# Patient Record
Sex: Male | Born: 1965 | Race: Black or African American | Hispanic: No | Marital: Married | Smoking: Never smoker
Health system: Southern US, Community
[De-identification: ages and names within clinical notes are randomized; demographics above are authoritative.]

## PROBLEM LIST (undated history)

## (undated) DIAGNOSIS — E119 Type 2 diabetes mellitus without complications: Secondary | ICD-10-CM

## (undated) DIAGNOSIS — H3320 Serous retinal detachment, unspecified eye: Secondary | ICD-10-CM

## (undated) HISTORY — PX: CHOLECYSTECTOMY: SHX55

## (undated) HISTORY — PX: KNEE SURGERY: SHX244

## (undated) HISTORY — PX: RETINAL DETACHMENT SURGERY: SHX105

---

## 2004-11-26 ENCOUNTER — Ambulatory Visit: Payer: Self-pay | Admitting: Orthopedic Surgery

## 2005-01-22 ENCOUNTER — Other Ambulatory Visit: Payer: Self-pay

## 2005-01-27 ENCOUNTER — Ambulatory Visit: Payer: Self-pay | Admitting: Orthopedic Surgery

## 2005-11-06 ENCOUNTER — Ambulatory Visit: Payer: Self-pay | Admitting: Family Medicine

## 2006-01-08 ENCOUNTER — Emergency Department: Payer: Self-pay | Admitting: Emergency Medicine

## 2007-10-24 ENCOUNTER — Ambulatory Visit: Payer: Self-pay | Admitting: Internal Medicine

## 2007-10-26 ENCOUNTER — Ambulatory Visit: Payer: Self-pay | Admitting: Internal Medicine

## 2008-06-01 ENCOUNTER — Other Ambulatory Visit: Payer: Self-pay

## 2008-06-01 ENCOUNTER — Emergency Department: Payer: Self-pay | Admitting: Emergency Medicine

## 2008-06-21 ENCOUNTER — Ambulatory Visit: Payer: Self-pay | Admitting: Family Medicine

## 2009-06-25 ENCOUNTER — Emergency Department: Payer: Self-pay | Admitting: Emergency Medicine

## 2012-02-02 ENCOUNTER — Ambulatory Visit: Payer: Self-pay

## 2012-10-26 ENCOUNTER — Ambulatory Visit: Payer: Self-pay | Admitting: Internal Medicine

## 2013-01-12 ENCOUNTER — Ambulatory Visit: Payer: Self-pay | Admitting: Internal Medicine

## 2013-02-07 ENCOUNTER — Ambulatory Visit: Payer: Self-pay | Admitting: Emergency Medicine

## 2013-02-07 LAB — CBC WITH DIFFERENTIAL/PLATELET
Basophil %: 0.9 %
Eosinophil #: 0.2 10*3/uL (ref 0.0–0.7)
Eosinophil %: 1.9 %
HCT: 43.3 % (ref 40.0–52.0)
Lymphocyte #: 2.2 10*3/uL (ref 1.0–3.6)
Lymphocyte %: 24.5 %
MCH: 28.4 pg (ref 26.0–34.0)
MCV: 86 fL (ref 80–100)
Neutrophil #: 5.9 10*3/uL (ref 1.4–6.5)
Neutrophil %: 65.5 %
Platelet: 259 10*3/uL (ref 150–440)
WBC: 9 10*3/uL (ref 3.8–10.6)

## 2013-02-07 LAB — COMPREHENSIVE METABOLIC PANEL
Albumin: 3.7 g/dL (ref 3.4–5.0)
Alkaline Phosphatase: 108 U/L (ref 50–136)
Anion Gap: 11 (ref 7–16)
BUN: 11 mg/dL (ref 7–18)
Calcium, Total: 8.9 mg/dL (ref 8.5–10.1)
Co2: 26 mmol/L (ref 21–32)
Creatinine: 0.93 mg/dL (ref 0.60–1.30)
EGFR (African American): >60
EGFR (Non-African Amer.): >60
SGOT(AST): 12 U/L — ABNORMAL LOW (ref 15–37)
Total Protein: 7.6 g/dL (ref 6.4–8.2)

## 2013-03-18 ENCOUNTER — Ambulatory Visit: Payer: Self-pay | Admitting: Family Medicine

## 2014-01-14 ENCOUNTER — Ambulatory Visit: Payer: Self-pay | Admitting: Physician Assistant

## 2014-05-16 DIAGNOSIS — E119 Type 2 diabetes mellitus without complications: Secondary | ICD-10-CM | POA: Insufficient documentation

## 2014-05-16 DIAGNOSIS — M6740 Ganglion, unspecified site: Secondary | ICD-10-CM | POA: Insufficient documentation

## 2014-08-30 ENCOUNTER — Ambulatory Visit: Payer: Self-pay | Admitting: Physician Assistant

## 2015-01-11 DIAGNOSIS — E113519 Type 2 diabetes mellitus with proliferative diabetic retinopathy with macular edema, unspecified eye: Secondary | ICD-10-CM | POA: Insufficient documentation

## 2015-01-11 DIAGNOSIS — H3321 Serous retinal detachment, right eye: Secondary | ICD-10-CM | POA: Insufficient documentation

## 2015-01-11 DIAGNOSIS — H3341 Traction detachment of retina, right eye: Secondary | ICD-10-CM | POA: Insufficient documentation

## 2015-01-18 DIAGNOSIS — E669 Obesity, unspecified: Secondary | ICD-10-CM | POA: Insufficient documentation

## 2015-04-18 ENCOUNTER — Encounter: Payer: Self-pay | Admitting: Emergency Medicine

## 2015-04-18 ENCOUNTER — Ambulatory Visit: Payer: BLUE CROSS/BLUE SHIELD

## 2015-04-18 ENCOUNTER — Ambulatory Visit
Admission: EM | Admit: 2015-04-18 | Discharge: 2015-04-18 | Disposition: A | Discharge status: Home / Self Care | Payer: BLUE CROSS/BLUE SHIELD | Attending: Family Medicine | Admitting: Family Medicine

## 2015-04-18 DIAGNOSIS — M25461 Effusion, right knee: Secondary | ICD-10-CM | POA: Diagnosis not present

## 2015-04-18 DIAGNOSIS — M1711 Unilateral primary osteoarthritis, right knee: Secondary | ICD-10-CM | POA: Diagnosis not present

## 2015-04-18 DIAGNOSIS — M25561 Pain in right knee: Secondary | ICD-10-CM | POA: Diagnosis present

## 2015-04-18 HISTORY — DX: Serous retinal detachment, unspecified eye: H33.20

## 2015-04-18 HISTORY — DX: Type 2 diabetes mellitus without complications: E11.9

## 2015-04-18 LAB — URIC ACID: Uric Acid, Serum: 5.5 mg/dL (ref 4.4–7.6)

## 2015-04-18 MED ORDER — NAPROXEN 500 MG PO TABS: 500.0000 mg | ORAL_TABLET | Freq: Two times a day (BID) | ORAL | Status: DC

## 2015-04-18 NOTE — ED Notes (Signed)
Patient c/o right knee pain that started on Sunday.  Patient denies injury.

## 2015-04-18 NOTE — Discharge Instructions (Signed)
Seek medical attention if symptoms persist or worsen as discussed. °

## 2015-04-18 NOTE — ED Provider Notes (Signed)
Patient presents with right knee pain that started a few days ago. Patient denies any trauma or injury to the site. Patient does have a history of arthritis per patient. He states that he has had right knee surgery in the past but is unsure for what. He denies any history of gout. He does admit to having alcohol on Saturday. He also admits to doing a lot of physical activity over the weekend. His pain is mostly localized to the medial aspect of his knee. He denies any meniscal pathology in the past.  Review of systems negative except mentioned above. Vitals reviewed. Gen.-No apparent distress Respiratory-CTA bilateral Cardiac-regular rate rhythm Extremities-right knee: Mild effusion, no erythema, minimal warmth, decreased full extension and flexion due to discomfort, medial joint line tenderness, positive McMurray's, negative Lachman, negative anterior drawer, no significant varus or valgus instability, neurovascularly intact  Assessment and Plan: Patient with moderate degenerative changes to the right knee per x-ray. Given patient's medial knee pain he may also have meniscal pathology. He may require a MRI. Uric acid within normal limits. Naprosyn when necessary with food prescribed. Ice, rest, elevation also encouraged. I do recommend that patient follow up with orthopedics. I have suggested [facility]. Patient given disc of x-rays. I've encourage the patient to seek immediate medical attention if any worsening symptoms occur.  Jolene ProvostKirtida Wyndham Santilli, MD 04/18/15 [phone]

## 2015-12-05 DIAGNOSIS — E7800 Pure hypercholesterolemia, unspecified: Secondary | ICD-10-CM | POA: Insufficient documentation

## 2015-12-17 DIAGNOSIS — H43822 Vitreomacular adhesion, left eye: Secondary | ICD-10-CM | POA: Insufficient documentation

## 2016-03-27 DIAGNOSIS — I861 Scrotal varices: Secondary | ICD-10-CM | POA: Insufficient documentation

## 2016-08-17 ENCOUNTER — Ambulatory Visit
Admission: EM | Admit: 2016-08-17 | Discharge: 2016-08-17 | Disposition: A | Discharge status: Home / Self Care | Payer: BLUE CROSS/BLUE SHIELD | Attending: Family Medicine | Admitting: Family Medicine

## 2016-08-17 DIAGNOSIS — L0291 Cutaneous abscess, unspecified: Secondary | ICD-10-CM | POA: Diagnosis not present

## 2016-08-17 MED ORDER — SULFAMETHOXAZOLE-TRIMETHOPRIM 800-160 MG PO TABS: 1.0000 | ORAL_TABLET | Freq: Two times a day (BID) | ORAL | 0 refills | Status: AC

## 2016-08-17 NOTE — ED Triage Notes (Signed)
Patient has an abscess on the back of his head that occurred after getting his hair cut. Patient states that area has been draining and very painful. Patient states that this has been constant and worsening for 1.5 weeks.

## 2016-08-17 NOTE — ED Provider Notes (Signed)
[facility]    CSN: [id number] Arrival date & time: 08/17/16  0902     History   Chief Complaint Chief Complaint  Patient presents with  . Abscess    HPI Paul Mcgrath is a 50 y.o. male.   HPI: Patient presents today with draining abscess on occiput. Patient states that he noticed it a few days ago. He denies any fever or chills or any fluctuations in his blood sugar recently. He denies any known history of MRSA in the past. He denies any other lesions similar to this. He believes that the abscess may have started after he got his hair cut. He believes his last tetanus immunization was within the last 5 years.  Past Medical History:  Diagnosis Date  . Detached retina    right eye  . Diabetes mellitus without complication (HCC)     There are no active problems to display for this patient.   Past Surgical History:  Procedure Laterality Date  . CHOLECYSTECTOMY    . KNEE SURGERY Right        Home Medications    Prior to Admission medications   Medication Sig Start Date End Date Taking? Authorizing Provider  glipiZIDE (GLUCOTROL) 10 MG tablet Take 10 mg by mouth daily before breakfast.   Yes Historical Provider, MD  metFORMIN (GLUCOPHAGE) 500 MG tablet Take 500 mg by mouth 2 (two) times daily with a meal.   Yes Historical Provider, MD  sitaGLIPtin (JANUVIA) 50 MG tablet Take 50 mg by mouth daily.   Yes Historical Provider, MD  naproxen (NAPROSYN) 500 MG tablet Take 1 tablet (500 mg total) by mouth 2 (two) times daily. 04/18/15   Jolene Provost, MD  sulfamethoxazole-trimethoprim (BACTRIM DS,SEPTRA DS) 800-160 MG tablet Take 1 tablet by mouth 2 (two) times daily. 08/17/16 08/24/16  Jolene Provost, MD    Family History History reviewed. No pertinent family history.  Social History Social History  Substance Use Topics  . Smoking status: Never Smoker  . Smokeless tobacco: Never Used  . Alcohol use Yes     Allergies   Adhesive [tape] and Shrimp [shellfish  allergy]   Review of Systems Review of Systems:Negative except mentioned above.    Physical Exam Triage Vital Signs ED Triage Vitals  Enc Vitals Group     BP 08/17/16 0925 (!) 150/87     Pulse Rate 08/17/16 0925 72     Resp 08/17/16 0925 16     Temp 08/17/16 0925 98.6 F (37 C)     Temp Source 08/17/16 0925 Tympanic     SpO2 08/17/16 0925 98 %     Weight 08/17/16 0923 260 lb (117.9 kg)     Height 08/17/16 0923 5\' 11"  (1.803 m)     Head Circumference --      Peak Flow --      Pain Score 08/17/16 0924 8     Pain Loc --      Pain Edu? --      Excl. in [place]? --    No data found.   Updated Vital Signs BP (!) 150/87 (BP Location: Left Arm)   Pulse 72   Temp 98.6 F (37 C) (Tympanic)   Resp 16   Ht 5\' 11"  (1.803 m)   Wt 260 lb (117.9 kg)   SpO2 98%   BMI 36.26 kg/m     Physical Exam:  GENERAL: NAD RESP: CTA B CARD: RRR SKIN: quarter sized slightly fluctuant area on lower occiput, some  discharge that is matted, mild tenderness of site, no streaks appreciated  NEURO: CN II-XII grossly intact     UC Treatments / Results  Labs (all labs ordered are listed, but only abnormal results are displayed) Labs Reviewed - No data to display  EKG  EKG Interpretation None       Radiology No results found.  Procedures Procedures (including critical care time)  Medications Ordered in UC Medications - No data to display   Initial Impression / Assessment and Plan / UC Course  I have reviewed the triage vital signs and the nursing notes.  Pertinent labs & imaging results that were available during my care of the patient were reviewed by me and considered in my medical decision making (see chart for details).  Clinical Course   A/P: Abscess occiput - will treat with Bactrim DS 10 days, patient is to follow-up with primary care physician this week or [facility], encourage patient to apply warm compresses to the area a few times daily, if any worsening symptoms seek  immediate medical attention as discussed. Monitor blood sugars and for fever.  Final Clinical Impressions(s) / UC Diagnoses   Final diagnoses:  Abscess    New Prescriptions New Prescriptions   SULFAMETHOXAZOLE-TRIMETHOPRIM (BACTRIM DS,SEPTRA DS) 800-160 MG TABLET    Take 1 tablet by mouth 2 (two) times daily.     Jolene ProvostKirtida Kamden Reber, MD 08/17/16 [phone]

## 2016-08-18 ENCOUNTER — Telehealth: Payer: Self-pay | Admitting: Emergency Medicine

## 2016-08-18 ENCOUNTER — Other Ambulatory Visit: Payer: Self-pay

## 2016-08-18 ENCOUNTER — Ambulatory Visit (INDEPENDENT_AMBULATORY_CARE_PROVIDER_SITE_OTHER): Payer: BLUE CROSS/BLUE SHIELD | Admitting: Surgery

## 2016-08-18 ENCOUNTER — Encounter: Payer: Self-pay | Admitting: Surgery

## 2016-08-18 ENCOUNTER — Telehealth: Payer: Self-pay | Admitting: Surgery

## 2016-08-18 VITALS — BP 165/84 | HR 58 | Temp 97.9°F | Ht 71.0 in | Wt 271.0 lb

## 2016-08-18 DIAGNOSIS — L02811 Cutaneous abscess of head [any part, except face]: Secondary | ICD-10-CM | POA: Diagnosis not present

## 2016-08-18 DIAGNOSIS — L0291 Cutaneous abscess, unspecified: Secondary | ICD-10-CM | POA: Diagnosis not present

## 2016-08-18 DIAGNOSIS — E1165 Type 2 diabetes mellitus with hyperglycemia: Secondary | ICD-10-CM | POA: Insufficient documentation

## 2016-08-18 DIAGNOSIS — IMO0002 Reserved for concepts with insufficient information to code with codable children: Secondary | ICD-10-CM | POA: Insufficient documentation

## 2016-08-18 NOTE — Telephone Encounter (Signed)
Patient on schedule for today.

## 2016-08-18 NOTE — Patient Instructions (Signed)
Please come back tomorrow morning for a packing change on this dressing.

## 2016-08-18 NOTE — Telephone Encounter (Signed)
Returning patient's phone call.  Patient was told that his appointment with [facility] was scheduled for today 10/2 at 3:15pm at [facility].  Patient verbalized understanding.

## 2016-08-18 NOTE — Progress Notes (Signed)
  Surgical Consultation  08/18/2016  Paul Mcgrath is an 50 y.o. male.   CC: Occipital abscess  HPI: This a patient who is in the emergency room yesterday where an occipital abscess was identified and diagnosed. Apparently it was spontaneously draining and the patient was placed on Bactrim and asked to follow up with us today. This is causing him some pain but he denies fevers or chills has drained minimally  He is diabetic.  Past Medical History:  Diagnosis Date  . Detached retina    right eye  . Diabetes mellitus without complication [facility](HCC)     Past Surgical History:  Procedure Laterality Date  . CHOLECYSTECTOMY    . KNEE SURGERY Right     History reviewed. No pertinent family history.  Social History:  reports that he has never smoked. He has never used smokeless tobacco. He reports that he drinks alcohol. He reports that he does not use drugs.  Allergies:  Allergies  Allergen Reactions  . Adhesive [Tape] Rash    Blistering rash  . Shrimp [Shellfish Allergy] Swelling    Medications reviewed.   Review of Systems:   Review of Systems  Constitutional: Negative for chills, fever and weight loss.  HENT: Negative.   Eyes: Negative.   Respiratory: Negative.   Cardiovascular: Negative.   Gastrointestinal: Negative.   Genitourinary: Negative.   Musculoskeletal: Negative.   Skin: Negative.   Neurological: Negative.   Endo/Heme/Allergies: Negative.   Psychiatric/Behavioral: Negative.      Physical Exam:  There were no vitals taken for this visit.  Physical Exam  Constitutional: He is oriented to person, place, and time and well-developed, well-nourished, and in no distress. No distress.  HENT:  Head: Normocephalic and atraumatic.  Eyes: Pupils are equal, round, and reactive to light. Right eye exhibits no discharge. Left eye exhibits no discharge. No scleral icterus.  Neck: Normal range of motion.  Posterior neck mass at the occiput measuring approximately  2 x 2 centimeters with some surrounding erythema and purulent drainage minimal in nature. Tenderness is present  Lymphadenopathy:    He has no cervical adenopathy.  Neurological: He is alert and oriented to person, place, and time.  Skin: Skin is warm. No rash noted. He is not diaphoretic. There is erythema.  Psychiatric: Mood and affect normal.  Vitals reviewed.     No results found for this or any previous visit (from the past 48 hour(s)). No results found.  Assessment/Plan:  Urgent care reports reviewed. Patient presents with an abscess that is spontaneously draining albeit very minimal and I believe he has an underlying abscess that requires drainage measures prostate 2 x 2 centimeters with surrounding erythema  I'm recommending incision and drainage the options were reviewed the risk of bleeding infection recurrence and open wound were discussed as well as the potential for a drain placement or wick placement which could be removed tomorrow morning. And agreed to proceed.  Details of procedure: After obtaining informed consent, local anesthetic was infiltrated in skin and subcutaneous tissues tissues after the patient was prepped. A cruciate incision was then made and minimal purulence exuded this was cultured. An a quarter-inch Nu Gauze wick was placed as was a sterile dressing.  She tolerated the procedure well we will see him back tomorrow morning to change the wick.     Lattie Hawichard E Davier Tramell, MD, FACS

## 2016-08-18 NOTE — Telephone Encounter (Signed)
Patient has called and was referred to [facility] for a possible I&D of a cyst on the back of head. Patient was given antibiotic.   He complains of increased pain and would like to be seen today.   I did advise him that I would have a nurse call him back to discuss a day to have this done in the office due to the availability.

## 2016-08-18 NOTE — Addendum Note (Signed)
Addended by: Cameron ProudHILDERS, Znya Albino S on: 08/18/2016 04:24 PM   Modules accepted: Orders

## 2016-08-19 ENCOUNTER — Encounter: Payer: Self-pay | Admitting: Surgery

## 2016-08-19 ENCOUNTER — Ambulatory Visit (INDEPENDENT_AMBULATORY_CARE_PROVIDER_SITE_OTHER): Payer: BLUE CROSS/BLUE SHIELD | Admitting: Surgery

## 2016-08-19 ENCOUNTER — Ambulatory Visit: Payer: Self-pay | Admitting: Surgery

## 2016-08-19 VITALS — BP 161/91 | HR 66 | Temp 97.6°F | Ht 71.0 in | Wt 271.0 lb

## 2016-08-19 DIAGNOSIS — L02811 Cutaneous abscess of head [any part, except face]: Secondary | ICD-10-CM

## 2016-08-19 NOTE — Patient Instructions (Signed)
Please see your follow up appointment listed below.  °

## 2016-08-19 NOTE — ED Notes (Signed)
F/u phone contact with pt, states had abcess I&D'd by surgeon and "feels much better".

## 2016-08-19 NOTE — Progress Notes (Signed)
S post incision and drainage of occipital scalp mass. Patient feels better denies fevers or chills packing fell out as morning.  Much less erythema no purulence minimal necrosis at the skin edges shallow cavity no packing replaced  Cultures pending  Continue antibiotics and return on Thursday morning for reexamination

## 2016-08-21 ENCOUNTER — Encounter: Payer: Self-pay | Admitting: Surgery

## 2016-08-21 ENCOUNTER — Ambulatory Visit (INDEPENDENT_AMBULATORY_CARE_PROVIDER_SITE_OTHER): Payer: BLUE CROSS/BLUE SHIELD | Admitting: Surgery

## 2016-08-21 VITALS — BP 153/79 | HR 64 | Temp 97.6°F | Ht 71.0 in | Wt 271.0 lb

## 2016-08-21 DIAGNOSIS — L02811 Cutaneous abscess of head [any part, except face]: Secondary | ICD-10-CM

## 2016-08-21 LAB — AEROBIC CULTURE W GRAM STAIN (SUPERFICIAL SPECIMEN)

## 2016-08-21 LAB — AEROBIC CULTURE  (SUPERFICIAL SPECIMEN)

## 2016-08-21 NOTE — Patient Instructions (Signed)
Please follow-up next week as scheduled.  Please call with any questions or concerns prior to your next appointment.

## 2016-08-21 NOTE — Progress Notes (Signed)
Outpatient postop visit  08/21/2016  Greggory Stallionommy L Mcclane is an 50 y.o. male.    Procedure: I&D scalp abscess  CC: Wound  HPI: Status post I&D of a scalp abscess on Bactrim with cultures showing gram-positive cocci likely staph aureus sensitivities pending  Medications reviewed.    Physical Exam:  There were no vitals taken for this visit.    PE: Wound is granulating well some fibropurulent material is removed lightly without difficulty no overt purulence and no erythema at this point    Assessment/Plan:  Overall improving on Bactrim continue dressing changes and will follow-up late next week.  Lattie Hawichard E Krystyna Cleckley, MD, FACS

## 2016-08-25 ENCOUNTER — Telehealth: Payer: Self-pay

## 2016-08-25 NOTE — Telephone Encounter (Signed)
Spoke with patient at this time regarding Culture results-MRSA. Patient is currently taking Bactrim which is sensitive and no need to change at this time. Patient has follow up appointment on 08/28/16 with Dr. Aleen CampiPiscoya.

## 2016-08-25 NOTE — Telephone Encounter (Signed)
LVM for patient to call office regarding Culture results.  Patient is currently taking Bactrim which is sensitive.

## 2016-08-28 ENCOUNTER — Ambulatory Visit (INDEPENDENT_AMBULATORY_CARE_PROVIDER_SITE_OTHER): Payer: BLUE CROSS/BLUE SHIELD | Admitting: Surgery

## 2016-08-28 ENCOUNTER — Encounter: Payer: Self-pay | Admitting: Surgery

## 2016-08-28 DIAGNOSIS — L089 Local infection of the skin and subcutaneous tissue, unspecified: Secondary | ICD-10-CM

## 2016-08-28 DIAGNOSIS — L723 Sebaceous cyst: Secondary | ICD-10-CM | POA: Diagnosis not present

## 2016-08-28 NOTE — Progress Notes (Signed)
08/28/2016  HPI: Patient is status post incision and drainage of an infected scalp sebaceous cyst by Dr. Excell Seltzerooper on 10/2. He's been seen a few times postprocedure and he has been healing well. Today he presents 1 week after his last visit for follow-up. He reports that he has not noticed any further drainage and he has finished his antibiotic course without complications. He denies having any new pain or swelling over the area of the abscess.  Vital signs: BP (!) 145/84   Pulse 76   Temp 97.5 F (36.4 C) (Oral)   Ht 5\' 11"  (1.803 m)   Wt 120.7 kg (266 lb)   BMI 37.10 kg/m    Physical Exam: Constitutional: No acute distress Scalp: The posterior scalp has an area of the prior incision and drainage with a wound base that measures about a centimeter in diameter. This has healed almost completely now. There is no cellulitis no fluctuance and no expressible drainage. The area is nontender.  Assessment/Plan: 50 year old male status post I&D of an infected sebaceous cyst of the scalp.  I reassured the patient that the wound continues to heal well. He may shower and let the water run down and I have advised him not to scrub to heavily and dab dry only. Can follow up with us on an as-needed basis and have a advised the patient of signs and symptoms to look out for including worsening tenderness increased swelling or purulent drainage from that wound area. He knows to call the office if any of the symptoms were to happen. Patient agrees with this plan and all of his questions have been answered.   Howie IllJose Luis Savayah Waltrip, MD [facility]

## 2016-08-28 NOTE — Patient Instructions (Signed)
Please call our office if you have any questions or concerns.  

## 2017-08-25 ENCOUNTER — Ambulatory Visit
Admission: EM | Admit: 2017-08-25 | Discharge: 2017-08-25 | Disposition: A | Discharge status: Home / Self Care | Payer: BLUE CROSS/BLUE SHIELD | Attending: Family Medicine | Admitting: Family Medicine

## 2017-08-25 DIAGNOSIS — L03116 Cellulitis of left lower limb: Secondary | ICD-10-CM | POA: Diagnosis not present

## 2017-08-25 DIAGNOSIS — L97921 Non-pressure chronic ulcer of unspecified part of left lower leg limited to breakdown of skin: Secondary | ICD-10-CM

## 2017-08-25 MED ORDER — CEPHALEXIN 500 MG PO CAPS: 500.0000 mg | ORAL_CAPSULE | Freq: Four times a day (QID) | ORAL | 0 refills | Status: DC

## 2017-08-25 NOTE — Discharge Instructions (Signed)
Silvadene twice daily

## 2017-08-25 NOTE — ED Provider Notes (Signed)
[facility]    CSN: [id number] Arrival date & time: 08/25/17  1437     History   Chief Complaint Chief Complaint  Patient presents with  . Ankle Injury    left    HPI Paul Mcgrath is a 51 y.o. male.   51 yo male with a c/o left shin skin ulceration and surrounding slight redness, swelling and pain after injuring it one week ago. Patient states he fell on his treadmill at home scratching his left shin but does not seem to be healing fast enough. States area has had some slight drainage and he's been cleaning it with hydrogen peroxide daily. Denies any fevers or chills.    The history is provided by the patient.  Ankle Injury     Past Medical History:  Diagnosis Date  . Detached retina    right eye  . Diabetes mellitus without complication [facility])     Patient Active Problem List   Diagnosis Date Noted  . Infected sebaceous cyst of skin 08/28/2016  . Uncontrolled type 2 diabetes mellitus ([place]) 08/18/2016  . Left varicocele 03/27/2016  . 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 Hypercholesterolemia 12/05/2015  . Obesity 01/18/2015  . 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 Right retinal detachment 01/11/2015  . 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 Ganglion 05/16/2014  . Type II diabetes mellitus ([place]) 05/16/2014    Past Surgical History:  Procedure Laterality Date  . CHOLECYSTECTOMY    . KNEE SURGERY Right   . RETINAL DETACHMENT SURGERY         Home Medications    Prior to Admission medications   Medication Sig Start Date End Date Taking? Authorizing Provider  Blood Glucose Monitoring Suppl (FIFTY50 GLUCOSE METER 2.0) w/Device KIT Use as directed. 12/21/13  Yes [provider]  docusate sodium (STOOL SOFTENER) 100 MG capsule Take 100 mg by mouth. 05/03/14  Yes [provider]  dorzolamide-timolol (COSOPT) 22.3-6.8  MG/ML ophthalmic solution 1 drop 2 (two) times daily.   Yes [provider]  glipiZIDE (GLUCOTROL) 5 MG tablet Take 5 mg by mouth. 01/05/14  Yes [provider]  glucose blood (ONE TOUCH ULTRA TEST) test strip  03/05/16  Yes [provider]  metFORMIN (GLUCOPHAGE) 500 MG tablet Take 500 mg by mouth 2 (two) times daily with a meal.   Yes [provider]  simvastatin (ZOCOR) 20 MG tablet Take by mouth. 12/05/15 08/25/17 Yes [provider]  sitaGLIPtin (JANUVIA) 100 MG tablet TAKE 1 TABLET DAILY 01/30/16  Yes [provider]  sitaGLIPtin (JANUVIA) 50 MG tablet Take 50 mg by mouth daily.   Yes [provider]  cephALEXin (KEFLEX) 500 MG capsule Take 1 capsule (500 mg total) by mouth 4 (four) times daily. 08/25/17   Norval Gable, MD  naproxen (NAPROSYN) 500 MG tablet Take 1 tablet (500 mg total) by mouth 2 (two) times daily. 04/18/15   Paulina Fusi, MD    Family History Family History  Problem Relation Age of Onset  . Hypertension Mother   . Diabetes Mother   . Hypertension Father     Social History Social History  Substance Use Topics  . Smoking status: Never Smoker  . Smokeless tobacco: Never Used  . Alcohol use Yes     Allergies   Adhesive [tape] and Shrimp [shellfish allergy]   Review of Systems Review of Systems   Physical Exam Triage  Vital Signs ED Triage Vitals  Enc Vitals Group     BP 08/25/17 1519 (!) 159/87     Pulse Rate 08/25/17 1519 66     Resp 08/25/17 1519 18     Temp 08/25/17 1519 98.1 F (36.7 C)     Temp Source 08/25/17 1519 Oral     SpO2 08/25/17 1519 100 %     Weight 08/25/17 1518 275 lb (124.7 kg)     Height 08/25/17 1518 5' 11"  (1.803 m)     Head Circumference --      Peak Flow --      Pain Score 08/25/17 1518 2     Pain Loc --      Pain Edu? --      Excl. in [place]? --    No data found.   Updated Vital Signs BP (!) 159/87 (BP Location: Left Arm)   Pulse 66   Temp 98.1 F (36.7 C)  (Oral)   Resp 18   Ht 5' 11"  (1.803 m)   Wt 275 lb (124.7 kg)   SpO2 100%   BMI 38.35 kg/m   Visual Acuity Right Eye Distance:   Left Eye Distance:   Bilateral Distance:    Right Eye Near:   Left Eye Near:    Bilateral Near:     Physical Exam  Constitutional: He appears well-developed and well-nourished. No distress.  Skin: He is not diaphoretic. There is erythema.  3cm skin ulceration over left shin area with surrounding blanchable erythema, warmth, mild edema and tenderness to palpation  Nursing note and vitals reviewed.    UC Treatments / Results  Labs (all labs ordered are listed, but only abnormal results are displayed) Labs Reviewed - No data to display  EKG  EKG Interpretation None       Radiology No results found.  Procedures Procedures (including critical care time)  Medications Ordered in UC Medications - No data to display   Initial Impression / Assessment and Plan / UC Course  I have reviewed the triage vital signs and the nursing notes.  Pertinent labs & imaging results that were available during my care of the patient were reviewed by me and considered in my medical decision making (see chart for details).       Final Clinical Impressions(s) / UC Diagnoses   Final diagnoses:  Skin ulcer of left lower leg, limited to breakdown of skin ([place])  Cellulitis of left lower extremity    New Prescriptions Discharge Medication List as of 08/25/2017  4:10 PM    START taking these medications   Details  cephALEXin (KEFLEX) 500 MG capsule Take 1 capsule (500 mg total) by mouth 4 (four) times daily., Starting Tue 08/25/2017, Normal       1. diagnosis reviewed with patient 2. rx as per orders above; reviewed possible side effects, interactions, risks and benefits  3. Recommend supportive treatment with silvadene bid, elevate  4. Follow-up prn if symptoms worsen or don't improve Controlled Substance Prescriptions  Controlled Substance Registry  consulted? Not Applicable   Norval Gable, MD 08/25/17 [phone]

## 2017-08-25 NOTE — ED Triage Notes (Signed)
Patient states that one week ago he was on the treadmill and slipped and scratched the bottom of his shin. Patient states that area has not been healing. Patient states that he is a diabetic and is concerned about infection.

## 2017-12-31 ENCOUNTER — Encounter (INDEPENDENT_AMBULATORY_CARE_PROVIDER_SITE_OTHER): Payer: Self-pay

## 2018-04-05 ENCOUNTER — Ambulatory Visit
Admission: EM | Admit: 2018-04-05 | Discharge: 2018-04-05 | Disposition: A | Discharge status: Home / Self Care | Payer: BLUE CROSS/BLUE SHIELD | Attending: Registered Nurse | Admitting: Registered Nurse

## 2018-04-05 ENCOUNTER — Encounter: Payer: Self-pay | Admitting: Emergency Medicine

## 2018-04-05 ENCOUNTER — Ambulatory Visit (INDEPENDENT_AMBULATORY_CARE_PROVIDER_SITE_OTHER): Payer: BLUE CROSS/BLUE SHIELD

## 2018-04-05 ENCOUNTER — Other Ambulatory Visit: Payer: Self-pay

## 2018-04-05 DIAGNOSIS — M1711 Unilateral primary osteoarthritis, right knee: Secondary | ICD-10-CM | POA: Diagnosis not present

## 2018-04-05 DIAGNOSIS — S86911A Strain of unspecified muscle(s) and tendon(s) at lower leg level, right leg, initial encounter: Secondary | ICD-10-CM | POA: Diagnosis not present

## 2018-04-05 MED ORDER — ACETAMINOPHEN 500 MG PO TABS: 1000.0000 mg | ORAL_TABLET | Freq: Four times a day (QID) | ORAL | 0 refills | Status: AC | PRN

## 2018-04-05 NOTE — ED Provider Notes (Signed)
[facility]    CSN: [id number] Arrival date & time: 04/05/18  1003     History   Chief Complaint Chief Complaint  Patient presents with  . Knee Pain    right    HPI Paul Mcgrath is a 52 y.o. male.   52y/o established male patient here for evaluation right knee pain s/p tripping over a shoe and falling into his recliner last evening.  Felt and heard a pop from his knee.  Painful today elevated knee and took over the counter NSAID with some relief this morning.  Last blood sugar check yesterday-am fasting 150s pm after meal 200s.  Has can and knee brace at home but hasn't used it.  Limping around this am.  Denied locking or giving out.  Some swelling and knee warm to touch this morning.  Patient reported he walked from car in parking lot to check in desk and then was given a wheelchair; "I am limping a little"     Past Medical History:  Diagnosis Date  . Detached retina    right eye  . Diabetes mellitus without complication [facility])     Patient Active Problem List   Diagnosis Date Noted  . Infected sebaceous cyst of skin 08/28/2016  . Uncontrolled type 2 diabetes mellitus ([place]) 08/18/2016  . Left varicocele 03/27/2016  . 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 Hypercholesterolemia 12/05/2015  . Obesity 01/18/2015  . 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 Right retinal detachment 01/11/2015  . 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 Ganglion 05/16/2014  . Type II diabetes mellitus ([place]) 05/16/2014    Past Surgical History:  Procedure Laterality Date  . CHOLECYSTECTOMY    . KNEE SURGERY Right   . RETINAL DETACHMENT SURGERY         Home Medications    Prior to Admission medications   Medication Sig Start Date End Date Taking? Authorizing Provider  dorzolamide-timolol (COSOPT) 22.3-6.8 MG/ML ophthalmic solution 1 drop 2 (two) times daily.    Yes [provider]  Exenatide ER (BYDUREON) 2 MG SRER Inject into the skin. 04/01/18  Yes [provider]  glipiZIDE (GLUCOTROL) 5 MG tablet Take 5 mg by mouth. 01/05/14  Yes [provider]  metFORMIN (GLUCOPHAGE) 500 MG tablet Take 500 mg by mouth 2 (two) times daily with a meal.   Yes [provider]  simvastatin (ZOCOR) 20 MG tablet Take by mouth. 12/05/15 04/05/18 Yes [provider]  acetaminophen (TYLENOL) 500 MG tablet Take 2 tablets (1,000 mg total) by mouth every 6 (six) hours as needed for mild pain or moderate pain (one hour before bydureon). 04/05/18 05/05/18  Demani Weyrauch, Aura Fey, NP  Blood Glucose Monitoring Suppl (FIFTY50 GLUCOSE METER 2.0) w/Device KIT Use as directed. 12/21/13   [provider]  glucose blood (ONE TOUCH ULTRA TEST) test strip  03/05/16   [provider]    Family History Family History  Problem Relation Age of Onset  . Hypertension Mother   . Diabetes Mother   . Hypertension Father     Social History Social History   Tobacco Use  . Smoking status: Never Smoker  . Smokeless tobacco: Never Used  Substance Use Topics  . Alcohol use: Yes    Alcohol/week: 2.4 oz    Types: 4 Cans of beer per week  . Drug use: No     Allergies   Adhesive [tape] and  Shrimp [shellfish allergy]   Review of Systems Review of Systems  Constitutional: Negative for activity change, appetite change, chills, diaphoresis, fatigue and fever.  HENT: Negative for trouble swallowing and voice change.   Eyes: Negative for photophobia and visual disturbance.  Respiratory: Negative for cough, shortness of breath, wheezing and stridor.   Cardiovascular: Negative for chest pain.  Gastrointestinal: Negative for diarrhea, nausea and vomiting.  Endocrine: Negative for cold intolerance and heat intolerance.  Genitourinary: Negative for difficulty urinating.  Musculoskeletal: Positive for gait problem and joint swelling.    Allergic/Immunologic: Negative for food allergies.  Neurological: Negative for dizziness, tremors, syncope, weakness and numbness.  Hematological: Negative for adenopathy. Does not bruise/bleed easily.  Psychiatric/Behavioral: Negative for agitation, confusion and sleep disturbance.     Physical Exam Triage Vital Signs ED Triage Vitals  Enc Vitals Group     BP 04/05/18 1037 (!) 160/87     Pulse Rate 04/05/18 1037 66     Resp 04/05/18 1037 16     Temp 04/05/18 1037 97.7 F (36.5 C)     Temp Source 04/05/18 1037 Oral     SpO2 04/05/18 1037 98 %     Weight 04/05/18 1037 270 lb (122.5 kg)     Height 04/05/18 1037 5' 11"  (1.803 m)     Head Circumference --      Peak Flow --      Pain Score 04/05/18 1036 8     Pain Loc --      Pain Edu? --      Excl. in [place]? --    No data found.  Updated Vital Signs BP (!) 160/87 (BP Location: Left Arm)   Pulse 66   Temp 97.7 F (36.5 C) (Oral)   Resp 16   Ht 5' 11"  (1.803 m)   Wt 270 lb (122.5 kg)   SpO2 98%   BMI 37.66 kg/m    Physical Exam  Constitutional: He is oriented to person, place, and time. Vital signs are normal. He appears well-developed and well-nourished. He is active and cooperative.  Non-toxic appearance. He does not have a sickly appearance. He does not appear ill. No distress.  HENT:  Head: Normocephalic and atraumatic.  Right Ear: Hearing and external ear normal.  Left Ear: Hearing and external ear normal.  Nose: Nose normal.  Mouth/Throat: Uvula is midline, oropharynx is clear and moist and mucous membranes are normal. No oropharyngeal exudate.  Eyes: Pupils are equal, round, and reactive to light. Conjunctivae, EOM and lids are normal. Right eye exhibits no discharge. Left eye exhibits no discharge.  Neck: Trachea normal, normal range of motion and phonation normal. Neck supple. No tracheal tenderness present. No neck rigidity. No tracheal deviation, no edema, no erythema and normal range of motion present.   Cardiovascular: Normal rate, regular rhythm and intact distal pulses.  Pulses:      Radial pulses are 2+ on the right side, and 2+ on the left side.  Pulmonary/Chest: Effort normal and breath sounds normal. No stridor. No respiratory distress. He has no wheezes. He has no rhonchi. He has no rales.  No cough observed in exam room; spoke full sentences without difficulty  Abdominal: Soft. He exhibits no distension, no fluid wave and no ascites. There is no rigidity and no guarding.  Musculoskeletal: He exhibits edema and tenderness. He exhibits no deformity.       Right shoulder: Normal.       Left shoulder: Normal.  Right elbow: Normal.      Left elbow: Normal.       Right hip: Normal.       Left hip: Normal.       Right knee: He exhibits decreased range of motion, swelling, effusion and bony tenderness. He exhibits no ecchymosis, no deformity, no laceration, normal alignment, no LCL laxity, normal patellar mobility and no MCL laxity. Tenderness found. Medial joint line and lateral joint line tenderness noted. No MCL, no LCL and no patellar tendon tenderness noted.       Left knee: Normal.       Right ankle: Normal.       Left ankle: Normal.       Cervical back: Normal.       Thoracic back: Normal.       Lumbar back: Normal.       Right hand: Normal.       Left hand: Normal.       Legs: Right knee crepitus with arom flexion to extension; negative lachman's/valgus/varus stress/ patellar apprehension/anterior/posterior drawer sign; gait slow and steady in hallway with 4 inch ace bandage applied to right knee and after icing 15 minutes  Lymphadenopathy:    He has no cervical adenopathy.       Right cervical: No superficial cervical, no deep cervical and no posterior cervical adenopathy present.      Left cervical: No superficial cervical, no deep cervical and no posterior cervical adenopathy present.  Neurological: He is alert and oriented to person, place, and time. He has normal  strength. He is not disoriented. He displays no atrophy and no tremor. No sensory deficit. He exhibits normal muscle tone. He displays no seizure activity. Gait abnormal. Coordination normal. GCS eye subscore is 4. GCS verbal subscore is 5. GCS motor subscore is 6.  In/out of wheelchair without difficulty; gait slow and steady in hallway on discharge;   Skin: Skin is warm, dry and intact. Capillary refill takes less than 2 seconds. No abrasion, no bruising, no ecchymosis and no rash noted. He is not diaphoretic. No cyanosis or erythema. No pallor. Nails show no clubbing.  Psychiatric: He has a normal mood and affect. His speech is normal and behavior is normal. Judgment and thought content normal. He is not actively hallucinating. Cognition and memory are normal. He is attentive.  Nursing note and vitals reviewed.    UC Treatments / Results  Labs (all labs ordered are listed, but only abnormal results are displayed) Labs Reviewed - No data to display  EKG None  Radiology Dg Knee Complete 4 Views Right  Result Date: 04/05/2018 CLINICAL DATA:  Twisting injury last night when the patient tripped on she use. Right knee pain. EXAM: RIGHT KNEE - COMPLETE 4+ VIEW COMPARISON:  Plain films right knee 04/18/2015. FINDINGS: There is no acute bony or joint abnormality. Degenerative disease about the knee is unchanged in appearance. Osteophytosis is most prominent on the medial side. No joint effusion. IMPRESSION: No acute abnormality. No change in the appearance of osteoarthritis about the knee. Electronically Signed   By: Inge Rise M.D.   On: 04/05/2018 11:18  Reviewed results with patient and given printed copy of report prior to discharge and patient refused offer of images on disc  Patient verbalized understanding information and had no further questions at this time.  Procedures Procedures (including critical care time)  Medications Ordered in UC Medications - No data to display  Initial  Impression / Assessment and Plan / UC  Course  I have reviewed the triage vital signs and the nursing notes.  Pertinent labs & imaging results that were available during my care of the patient were reviewed by me and considered in my medical decision making (see chart for details).     Knee sprain right Final Clinical Impressions(s) / UC Diagnoses   Final diagnoses:  Strain of right knee, initial encounter  Arthritis of knee, right   Heel slides TID prn, consider seated weights/universal machine for exercise this week and recumbant bike at [employer] (his membership gym).  Avoid high impact or prolonged walking this week.  Patient was instructed to rest, ice 15 minutes TID and elevate right leg.  Medications as directed tylenol 1043m po QID avoid motrin/advil/aleve/naproxen/ibuprofen as can worsen kidney function with his diabetes medication.  Use can for ambulation until swelling and pain resolved.  Call or return to clinic as needed if these symptoms worsen or fail to improve as anticipated with PCM/orthopedics for re-evaluation/further imaging e.g. MRI.  Discussed with patient may take 4-6 weeks for complete healing but should notice improvement over the next 2 weeks.  Patient verbalized agreement and understanding of treatment plan and had no further questions at this time. P2:  ROM exercises, Stretching, and Hand out given   Discharge Instructions     Schedule follow up with orthopedics/PCM if no improvement with plan of care x 7days e.g. Hot joint/swelling/pain Wear knee sleeve for next month at home Use can when walking; stretch daily/AROM   ED Prescriptions    Medication Sig Dispense Auth. Provider   acetaminophen (TYLENOL) 500 MG tablet Take 2 tablets (1,000 mg total) by mouth every 6 (six) hours as needed for mild pain or moderate pain (one hour before bydureon). 100 tablet Annaliza Zia, TAura Fey NP     Controlled Substance Prescriptions [place] Controlled Substance Registry  consulted? Not Applicable   BOlen Cordial NP 04/05/18 1213

## 2018-04-05 NOTE — Discharge Instructions (Addendum)
Schedule follow up with orthopedics/PCM if no improvement with plan of care x 7 days e.g. Hot joint/swelling/pain Wear knee sleeve for next month at home Use cane when walking; stretch daily/AROM

## 2018-04-05 NOTE — ED Triage Notes (Signed)
Patient in today c/o right knee pain since last night. Patient is visually impaired and didn't see a shoe in front of the chair he was about to sit in. He tripped on the shoe and fell into the chair.

## 2019-10-21 ENCOUNTER — Other Ambulatory Visit: Payer: Self-pay

## 2019-10-21 DIAGNOSIS — Z20822 Contact with and (suspected) exposure to covid-19: Secondary | ICD-10-CM

## 2019-10-24 ENCOUNTER — Telehealth: Payer: Self-pay

## 2019-10-24 LAB — NOVEL CORONAVIRUS, NAA: SARS-CoV-2, NAA: NOT DETECTED

## 2019-10-24 NOTE — Telephone Encounter (Signed)
Patient advise that result are not back yet

## 2020-02-13 ENCOUNTER — Ambulatory Visit: Payer: Medicare Other | Attending: Internal Medicine

## 2020-02-13 ENCOUNTER — Other Ambulatory Visit: Payer: Self-pay

## 2020-02-13 DIAGNOSIS — Z23 Encounter for immunization: Secondary | ICD-10-CM

## 2020-02-13 NOTE — Progress Notes (Signed)
   Covid-19 Vaccination Clinic  Name:  Paul Mcgrath    MRN: [id number] DOB: [birth year]  02/13/2020  Paul Mcgrath was observed post Covid-19 immunization for 15 minutes without incident. He was provided with Vaccine Information Sheet and instruction to access the V-Safe system.   Paul Mcgrath was instructed to call 911 with any severe reactions post vaccine: Marland Kitchen Difficulty breathing  . Swelling of face and throat  . A fast heartbeat  . A bad rash all over body  . Dizziness and weakness   Immunizations Administered    Name Date Dose VIS Date Route   Pfizer COVID-19 Vaccine 02/13/2020 11:40 AM 0.3 mL 10/28/2019 Intramuscular   Manufacturer: [employer], [employer]   Lot: [id number]   NDC: [address]-7

## 2020-02-28 ENCOUNTER — Other Ambulatory Visit: Payer: Self-pay | Admitting: Orthopedic Surgery

## 2020-02-28 DIAGNOSIS — M25512 Pain in left shoulder: Secondary | ICD-10-CM

## 2020-03-07 ENCOUNTER — Ambulatory Visit: Payer: Medicare Other | Attending: Internal Medicine

## 2020-03-07 ENCOUNTER — Other Ambulatory Visit: Payer: Self-pay

## 2020-03-07 ENCOUNTER — Ambulatory Visit
Admission: RE | Admit: 2020-03-07 | Discharge: 2020-03-07 | Disposition: A | Discharge status: Home / Self Care | Payer: Medicare Other | Source: Ambulatory Visit | Attending: Orthopedic Surgery | Admitting: Orthopedic Surgery

## 2020-03-07 DIAGNOSIS — M25512 Pain in left shoulder: Secondary | ICD-10-CM | POA: Insufficient documentation

## 2020-03-07 DIAGNOSIS — Z23 Encounter for immunization: Secondary | ICD-10-CM

## 2020-03-07 NOTE — Progress Notes (Signed)
   Covid-19 Vaccination Clinic  Name:  EDGER HUSAIN    MRN: [id number] DOB: 1966-10-10  03/07/2020  Mr. Buffkin was observed post Covid-19 immunization for 30 minutes based on pre-vaccination screening without incident. He was provided with Vaccine Information Sheet and instruction to access the V-Safe system.   Mr. Alesi was instructed to call 911 with any severe reactions post vaccine: Marland Kitchen Difficulty breathing  . Swelling of face and throat  . A fast heartbeat  . A bad rash all over body  . Dizziness and weakness   Immunizations Administered    Name Date Dose VIS Date Route   Pfizer COVID-19 Vaccine 03/07/2020  1:11 PM 0.3 mL 01/11/2019 Intramuscular   Manufacturer: [employer], [employer]   Lot: [id number]   NDC: [address]-0

## 2020-09-10 ENCOUNTER — Ambulatory Visit: Payer: Medicare Other

## 2020-11-05 ENCOUNTER — Ambulatory Visit
Admission: EM | Admit: 2020-11-05 | Discharge: 2020-11-05 | Disposition: A | Discharge status: Home / Self Care | Payer: Medicare Other | Attending: Sports Medicine | Admitting: Sports Medicine

## 2020-11-05 ENCOUNTER — Other Ambulatory Visit: Payer: Self-pay

## 2020-11-05 ENCOUNTER — Encounter: Payer: Self-pay | Admitting: Emergency Medicine

## 2020-11-05 DIAGNOSIS — R0981 Nasal congestion: Secondary | ICD-10-CM | POA: Diagnosis not present

## 2020-11-05 DIAGNOSIS — J019 Acute sinusitis, unspecified: Secondary | ICD-10-CM | POA: Diagnosis not present

## 2020-11-05 MED ORDER — AMOXICILLIN-POT CLAVULANATE 875-125 MG PO TABS: 1.0000 | ORAL_TABLET | Freq: Two times a day (BID) | ORAL | 0 refills | Status: AC

## 2020-11-05 NOTE — ED Provider Notes (Signed)
[facility]    CSN: [id number] Arrival date & time: 11/05/20  2505      History   Chief Complaint Chief Complaint  Patient presents with   Cough    HPI Paul Mcgrath is a 54 y.o. male presenting for 3 week history of yellow/green nasal drainage and productive cough.  Also admits to sinus pressure.  Patient denies any improvement or worsening of symptoms.  He denies any fever, fatigue, body aches, ear pain, sinus pain, chest pain, breathing difficulty.  Patient states that he had a negative Covid test a couple of weeks ago at symptom onset.  Denies any known COVID-19 exposure.  He is fully vaccinated for COVID-19.  He has been taking over-the-counter Mucinex and says that it does help temporarily.  Past medical history significant for diabetes.  He denies any cardiopulmonary disease.  Patient has no other complaints or concerns.  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 Authorizing Provider  dorzolamide-timolol (COSOPT) 22.3-6.8 MG/ML ophthalmic solution 1 drop 2  (two) times daily.   Yes [provider]  glipiZIDE (GLUCOTROL) 5 MG tablet Take 5 mg by mouth. 01/05/14  Yes [provider]  glucose blood test strip  03/05/16  Yes [provider]  metFORMIN (GLUCOPHAGE) 500 MG tablet Take 500 mg by mouth 2 (two) times daily with a meal.   Yes [provider]  simvastatin (ZOCOR) 20 MG tablet Take by mouth. 12/05/15 11/05/20 Yes [provider]  amoxicillin-clavulanate (AUGMENTIN) 875-125 MG tablet Take 1 tablet by mouth every 12 (twelve) hours for 7 days. 11/05/20 11/12/20  Danton Clap, PA-C  Blood Glucose Monitoring Suppl (FIFTY50 GLUCOSE METER 2.0) w/Device KIT Use as directed. 12/21/13   [provider]  Exenatide ER 2 MG SRER Inject into the skin. 04/01/18   [provider]    Family History Family History  Problem Relation Age of Onset   Hypertension Mother    Diabetes Mother    Hypertension Father     Social History Social History   Tobacco Use   Smoking status: Never Smoker   Smokeless tobacco: Never Used  Scientific laboratory technician Use: Never used  Substance Use Topics   Alcohol use: Yes    Alcohol/week: 4.0 standard drinks    Types: 4 Cans of beer per week   Drug use: No     Allergies   Adhesive [tape] and Shrimp [shellfish allergy]   Review of Systems Review of Systems  Constitutional: Negative for fatigue and fever.  HENT: Positive for  congestion, rhinorrhea and sinus pressure. Negative for sinus pain and sore throat.   Respiratory: Positive for cough. Negative for shortness of breath and wheezing.   Cardiovascular: Negative for chest pain.  Gastrointestinal: Negative for abdominal pain, diarrhea, nausea and vomiting.  Musculoskeletal: Negative for myalgias.  Neurological: Negative for weakness, light-headedness and headaches.  Hematological: Negative for adenopathy.     Physical Exam Triage Vital Signs ED Triage Vitals  Enc Vitals Group     BP 11/05/20  1030 (!) 146/85     Pulse Rate 11/05/20 1030 82     Resp 11/05/20 1030 18     Temp 11/05/20 1030 97.7 F (36.5 C)     Temp Source 11/05/20 1030 Oral     SpO2 11/05/20 1030 98 %     Weight 11/05/20 1028 270 lb 1 oz (122.5 kg)     Height 11/05/20 1028 5' 11"  (1.803 m)     Head Circumference --      Peak Flow --      Pain Score 11/05/20 1027 0     Pain Loc --      Pain Edu? --      Excl. in [place]? --    No data found.  Updated Vital Signs BP (!) 146/85 (BP Location: Left Arm)    Pulse 82    Temp 97.7 F (36.5 C) (Oral)    Resp 18    Ht 5' 11"  (1.803 m)    Wt 270 lb 1 oz (122.5 kg)    SpO2 98%    BMI 37.67 kg/m       Physical Exam Vitals and nursing note reviewed.  Constitutional:      General: He is not in acute distress.    Appearance: Normal appearance. He is well-developed and well-nourished. He is not ill-appearing or diaphoretic.  HENT:     Head: Normocephalic and atraumatic.     Right Ear: Tympanic membrane, ear canal and external ear normal.     Left Ear: Tympanic membrane, ear canal and external ear normal.     Nose: Congestion and rhinorrhea (moderate yellowish drainage) present.     Mouth/Throat:     Mouth: Oropharynx is clear and moist and mucous membranes are normal. Mucous membranes are moist.     Pharynx: Oropharynx is clear. Uvula midline. No posterior oropharyngeal erythema.     Tonsils: No tonsillar abscesses.  Eyes:     General: No scleral icterus.       Right eye: No discharge.        Left eye: No discharge.     Extraocular Movements: EOM normal.     Conjunctiva/sclera: Conjunctivae normal.  Neck:     Thyroid: No thyromegaly.     Trachea: No tracheal deviation.  Cardiovascular:     Rate and Rhythm: Normal rate and regular rhythm.     Heart sounds: Normal heart sounds.  Pulmonary:     Effort: Pulmonary effort is normal. No respiratory distress.     Breath sounds: Normal breath sounds. No wheezing or rales.  Musculoskeletal:     Cervical back: Normal  range of motion and neck supple.  Lymphadenopathy:     Cervical: No cervical adenopathy.  Skin:    General: Skin is warm and dry.     Findings: No rash.  Neurological:     General: No focal deficit present.     Mental Status: He is alert. Mental status is at baseline.     Motor: No weakness.  Gait: Gait normal.  Psychiatric:        Mood and Affect: Mood normal.        Behavior: Behavior normal.        Thought Content: Thought content normal.      UC Treatments / Results  Labs (all labs ordered are listed, but only abnormal results are displayed) Labs Reviewed - No data to display  EKG   Radiology No results found.  Procedures Procedures (including critical care time)  Medications Ordered in UC Medications - No data to display  Initial Impression / Assessment and Plan / UC Course  I have reviewed the triage vital signs and the nursing notes.  Pertinent labs & imaging results that were available during my care of the patient were reviewed by me and considered in my medical decision making (see chart for details).   54 year old male with 3-week history of nasal congestion and productive cough.  All vital signs are stable.  He is afebrile.  He does have moderate yellowish nasal drainage.  His chest is clear to auscultation.  Treating patient for acute sinusitis with Augmentin.  Advised to continue at home Flonase and Mucinex.  Advised increased rest and fluids.  Advised following up as needed for any new or worsening symptoms.  ED precautions reviewed with patient.   Final Clinical Impressions(s) / UC Diagnoses   Final diagnoses:  Acute sinusitis, recurrence not specified, unspecified location  Nasal congestion     Discharge Instructions     SINUSITIS: May consider use of intranasal steroids, such as Flonase as well. Use medications as directed. If antibiotics are prescribed, take the full course of antibiotics. Also consider use of Sudafed or Mucinex D.  Increase rest, fluids. If you do not improve or if you worsen after a course of antibiotics, you should be re-examined. You may need a different antibiotic or further evaluation with imaging or an exam of the inside of the sinuses     ED Prescriptions    Medication Sig Dispense Auth. Provider   amoxicillin-clavulanate (AUGMENTIN) 875-125 MG tablet Take 1 tablet by mouth every 12 (twelve) hours for 7 days. 14 tablet Gretta Cool     PDMP not reviewed this encounter.   Danton Clap, PA-C 11/05/20 1122

## 2020-11-05 NOTE — Discharge Instructions (Signed)
SINUSITIS: May consider use of intranasal steroids, such as Flonase as well. Use medications as directed. If antibiotics are prescribed, take the full course of antibiotics. Also consider use of Sudafed or Mucinex D. Increase rest, fluids. If you do not improve or if you worsen after a course of antibiotics, you should be re-examined. You may need a different antibiotic or further evaluation with imaging or an exam of the inside of the sinuses

## 2020-11-05 NOTE — ED Triage Notes (Signed)
Pt c/o cough, runny nose and nasal congestion. Started about 2 weeks ago. He has been tested for covid and was negative. Denies fever.

## 2020-11-19 ENCOUNTER — Other Ambulatory Visit: Payer: Self-pay

## 2020-11-19 ENCOUNTER — Ambulatory Visit
Admission: EM | Admit: 2020-11-19 | Discharge: 2020-11-19 | Discharge status: Against Medical Advice | Payer: Medicare Other

## 2020-11-19 NOTE — ED Notes (Signed)
Pt states he now has an appt with his PCP for today and left urgent care.

## 2021-03-30 ENCOUNTER — Ambulatory Visit
Admission: EM | Admit: 2021-03-30 | Discharge: 2021-03-30 | Disposition: A | Discharge status: Home / Self Care | Payer: Medicare Other

## 2021-03-30 ENCOUNTER — Other Ambulatory Visit: Payer: Self-pay

## 2021-03-30 ENCOUNTER — Encounter: Payer: Self-pay | Admitting: Emergency Medicine

## 2021-03-30 DIAGNOSIS — L03113 Cellulitis of right upper limb: Secondary | ICD-10-CM

## 2021-03-30 DIAGNOSIS — W57XXXA Bitten or stung by nonvenomous insect and other nonvenomous arthropods, initial encounter: Secondary | ICD-10-CM | POA: Diagnosis not present

## 2021-03-30 DIAGNOSIS — S40861A Insect bite (nonvenomous) of right upper arm, initial encounter: Secondary | ICD-10-CM

## 2021-03-30 MED ORDER — DOXYCYCLINE HYCLATE 100 MG PO CAPS: 100.0000 mg | ORAL_CAPSULE | Freq: Two times a day (BID) | ORAL | 0 refills | Status: AC

## 2021-03-30 NOTE — Discharge Instructions (Signed)
The area where the tick bit you does look very inflamed and swollen.  All of the swelling and inflammation can just be due to the bite itself.  However, since you are diabetic there is concern for possible infection.  We will cover you for infection at this time with doxycycline which is also used for tickborne illnesses.  Complete full course.  You can continue icing the area to help with swelling and keeping it elevated.  Clean with soap and water and apply Neosporin.  If you notice any increased swelling, redness or start to have a fever or feel generally unwell, please return or go to the emergency department for another evaluation.

## 2021-03-30 NOTE — ED Triage Notes (Signed)
Patient c/o tick bite to his right upper arm on Thursday.  Patient reports redness and swelling around the site.  Patient denies fevers.

## 2021-03-30 NOTE — ED Provider Notes (Signed)
[facility]    CSN: [id number] Arrival date & time: 03/30/21  0940      History   Chief Complaint Chief Complaint  Patient presents with  . Insect Bite    tick    HPI Paul Mcgrath is a 55 y.o. diabetic male presenting for redness and swelling of the right upper arm x2 days.  Patient states that he pulled a tick out of his arm a couple days ago and started to have subsequent redness and swelling after that.  He says the redness and swelling have gone down since icing the area.  He is also been cleaning it and applying Neosporin.  Patient concerned because the area feels hard and is tender.  He says the tick could not have been there any longer than a few hours.  Patient denies any systemic symptoms such as fever, fatigue, chills, achiness.  No swelling of any joint.  No other complaints or concerns.  HPI  Past Medical History:  Diagnosis Date  . Detached retina    right eye  . Diabetes mellitus without complication [facility])     Patient Active Problem List   Diagnosis Date Noted  . Infected sebaceous cyst of skin 08/28/2016  . Uncontrolled type 2 diabetes mellitus ([place]) 08/18/2016  . Left varicocele 03/27/2016  . 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 Hypercholesterolemia 12/05/2015  . Obesity 01/18/2015  . 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 Right retinal detachment 01/11/2015  . 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 Ganglion 05/16/2014  . Type II diabetes mellitus ([place]) 05/16/2014    Past Surgical History:  Procedure Laterality Date  . CHOLECYSTECTOMY    . KNEE SURGERY Right   . RETINAL DETACHMENT SURGERY         Home Medications    Prior to Admission medications   Medication Sig Start Date End Date Taking? Authorizing Provider  dorzolamide-timolol (COSOPT) 22.3-6.8 MG/ML ophthalmic solution 1 drop 2 (two) times daily.   Yes  [provider]  doxycycline (VIBRAMYCIN) 100 MG capsule Take 1 capsule (100 mg total) by mouth 2 (two) times daily for 10 days. 03/30/21 04/09/21 Yes Laurene Footman B, PA-C  glipiZIDE (GLUCOTROL) 5 MG tablet Take 5 mg by mouth. 01/05/14  Yes [provider]  hydrochlorothiazide (HYDRODIURIL) 12.5 MG tablet Take 12.5 mg by mouth daily. 03/16/21  Yes [provider]  losartan (COZAAR) 100 MG tablet Take 1 tablet by mouth daily. 02/23/21  Yes [provider]  metFORMIN (GLUCOPHAGE) 500 MG tablet Take 500 mg by mouth 2 (two) times daily with a meal.   Yes [provider]  simvastatin (ZOCOR) 20 MG tablet Take by mouth. 12/05/15 03/30/21 Yes [provider]  Blood Glucose Monitoring Suppl (FIFTY50 GLUCOSE METER 2.0) w/Device KIT Use as directed. 12/21/13   [provider]  Exenatide ER 2 MG SRER Inject into the skin. 04/01/18   [provider]  glucose blood test strip  03/05/16   [provider]    Family History Family History  Problem Relation Age of Onset  . Hypertension Mother   . Diabetes Mother   . Hypertension Father     Social History Social History   Tobacco Use  . Smoking status: Never Smoker  . Smokeless tobacco: Never Used  Vaping Use  . Vaping Use: Never used  Substance Use Topics  . Alcohol use: Yes    Alcohol/week: 4.0  standard drinks    Types: 4 Cans of beer per week  . Drug use: No     Allergies   Adhesive [tape] and Shrimp [shellfish allergy]   Review of Systems Review of Systems  Constitutional: Negative for chills, fatigue and fever.  Musculoskeletal: Negative for arthralgias and joint swelling.  Skin: Positive for color change and rash.  Neurological: Negative for weakness.     Physical Exam Triage Vital Signs ED Triage Vitals  Enc Vitals Group     BP 03/30/21 0958 137/80     Pulse Rate 03/30/21 0958 81     Resp 03/30/21 0958 16     Temp 03/30/21 0958 98.2 F (36.8 C)      Temp Source 03/30/21 0958 Oral     SpO2 03/30/21 0958 98 %     Weight 03/30/21 0956 255 lb (115.7 kg)     Height 03/30/21 0956 _0  (1.803 m)     Head Circumference --      Peak Flow --      Pain Score 03/30/21 0956 0     Pain Loc --      Pain Edu? --      Excl. in [place]? --    No data found.  Updated Vital Signs BP 137/80 (BP Location: Left Arm)   Pulse 81   Temp 98.2 F (36.8 C) (Oral)   Resp 16   Ht _1  (1.803 m)   Wt 255 lb (115.7 kg)   SpO2 98%   BMI 35.57 kg/m       Physical Exam Vitals and nursing note reviewed.  Constitutional:      General: He is not in acute distress.    Appearance: Normal appearance. He is well-developed. He is not ill-appearing.  HENT:     Head: Normocephalic and atraumatic.  Eyes:     General: No scleral icterus.    Conjunctiva/sclera: Conjunctivae normal.  Cardiovascular:     Rate and Rhythm: Normal rate and regular rhythm.     Heart sounds: Normal heart sounds.  Pulmonary:     Effort: Pulmonary effort is normal. No respiratory distress.     Breath sounds: Normal breath sounds.  Musculoskeletal:     Cervical back: Neck supple.  Skin:    General: Skin is warm and dry.     Comments: Right upper arm: About the biceps region, there is a small open wound with surrounding crusted material.  There is induration and erythema that extends about 2 cm in all directions.  Area is tender to palpation.  No active drainage.  Good range of motion of his elbow and shoulder.  Neurological:     General: No focal deficit present.     Mental Status: He is alert. Mental status is at baseline.     Motor: No weakness.     Gait: Gait normal.  Psychiatric:        Mood and Affect: Mood normal.        Behavior: Behavior normal.        Thought Content: Thought content normal.      UC Treatments / Results  Labs (all labs ordered are listed, but only abnormal results are displayed) Labs Reviewed - No data to display  EKG   Radiology No results  found.  Procedures Procedures (including critical care time)  Medications Ordered in UC Medications - No data to display  Initial Impression / Assessment and Plan / UC Course  I have reviewed the triage  vital signs and the nursing notes.  Pertinent labs & imaging results that were available during my care of the patient were reviewed by me and considered in my medical decision making (see chart for details).   55 year old male presenting for redness and swelling associated with tick bite x2 days.  Area is concerning for early cellulitis versus localized inflammatory/allergic type reaction.  Advised him to take Benadryl as needed for any itching.  Advised Tylenol for discomfort.  Advised to continue with cryotherapy.  Advised cleaning with soap and water and then applying the Neosporin.  Advise close monitoring.  We will start him on doxycycline to cover him for potential infection.  Also discussed that this is not really a Lyme endemic area, but the doxycycline used to treat the infection should cover that as well as other tickborne illnesses.  Advised him to follow-up with [place] for any new or worsening symptoms or if not improved after taking the medicine.  Patient agrees.   Final Clinical Impressions(s) / UC Diagnoses   Final diagnoses:  Cellulitis of right upper extremity  Tick bite of right upper arm, initial encounter     Discharge Instructions     The area where the tick bit you does look very inflamed and swollen.  All of the swelling and inflammation can just be due to the bite itself.  However, since you are diabetic there is concern for possible infection.  We will cover you for infection at this time with doxycycline which is also used for tickborne illnesses.  Complete full course.  You can continue icing the area to help with swelling and keeping it elevated.  Clean with soap and water and apply Neosporin.  If you notice any increased swelling, redness or start to have a fever or  feel generally unwell, please return or go to the emergency department for another evaluation.    ED Prescriptions    Medication Sig Dispense Auth. Provider   doxycycline (VIBRAMYCIN) 100 MG capsule Take 1 capsule (100 mg total) by mouth 2 (two) times daily for 10 days. 20 capsule Danton Clap, PA-C     PDMP not reviewed this encounter.   Danton Clap, PA-C 03/30/21 1018

## 2021-11-12 ENCOUNTER — Ambulatory Visit (INDEPENDENT_AMBULATORY_CARE_PROVIDER_SITE_OTHER): Payer: Medicare Other

## 2021-11-12 ENCOUNTER — Other Ambulatory Visit: Payer: Self-pay

## 2021-11-12 ENCOUNTER — Ambulatory Visit
Admission: EM | Admit: 2021-11-12 | Discharge: 2021-11-12 | Disposition: A | Discharge status: Home / Self Care | Payer: Medicare Other | Attending: Internal Medicine | Admitting: Internal Medicine

## 2021-11-12 DIAGNOSIS — S2239XA Fracture of one rib, unspecified side, initial encounter for closed fracture: Secondary | ICD-10-CM

## 2021-11-12 DIAGNOSIS — S2241XA Multiple fractures of ribs, right side, initial encounter for closed fracture: Secondary | ICD-10-CM

## 2021-11-12 DIAGNOSIS — W19XXXA Unspecified fall, initial encounter: Secondary | ICD-10-CM

## 2021-11-12 MED ORDER — HYDROCODONE-ACETAMINOPHEN 5-325 MG PO TABS: 1.0000 | ORAL_TABLET | Freq: Four times a day (QID) | ORAL | 0 refills | Status: AC | PRN

## 2021-11-12 NOTE — ED Triage Notes (Signed)
Pt c/o fall on Friday morning. Pt states that he went to use the restroom in the dark and his dog had ran under his feet and he tripped and hit the bathtub. Pt states that he is now having right side rib pain and back pain.

## 2021-11-12 NOTE — Discharge Instructions (Addendum)
Apply ice on area of pain for 2 days Follow up with your primary care provider next week.

## 2021-11-12 NOTE — ED Provider Notes (Signed)
[facility]    CSN: [id number] Arrival date & time: 11/12/21  1120      History   Chief Complaint Chief Complaint  Patient presents with   Fall    HPI Paul Mcgrath is a 55 y.o. male who presents with R lateral rib pain since he tripped on his dog when going to the bathroom 3 days ago. Has been taking Ibuprofen 800 mg and is not helping his pain.     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 Authorizing Provider  Blood Glucose Monitoring Suppl (FIFTY50 GLUCOSE METER 2.0) w/Device KIT Use as directed. 12/21/13  Yes [provider]  dorzolamide-timolol (COSOPT) 22.3-6.8 MG/ML ophthalmic solution 1 drop 2 (two) times daily.   Yes [provider]  Exenatide ER 2 MG SRER Inject into the skin. 04/01/18  Yes [provider]  glipiZIDE (GLUCOTROL) 5 MG tablet Take 5 mg by mouth. 01/05/14  Yes [provider]  glucose blood test strip  03/05/16  Yes [provider]  hydrochlorothiazide (HYDRODIURIL) 12.5 MG tablet Take 12.5 mg by mouth daily. 03/16/21  Yes  [provider]  HYDROcodone-acetaminophen (NORCO/VICODIN) 5-325 MG tablet Take 1 tablet by mouth every 6 (six) hours as needed. 11/12/21  Yes Rodriguez-Southworth, Sunday Spillers, PA-C  losartan (COZAAR) 100 MG tablet Take 1 tablet by mouth daily. 02/23/21  Yes [provider]  metFORMIN (GLUCOPHAGE) 500 MG tablet Take 500 mg by mouth 2 (two) times daily with a meal.   Yes [provider]  simvastatin (ZOCOR) 20 MG tablet Take by mouth. 12/05/15 11/12/21 Yes [provider]    Family History Family History  Problem Relation Age of Onset   Hypertension Mother    Diabetes Mother    Hypertension Father     Social History Social History   Tobacco Use   Smoking status: Never   Smokeless tobacco: Never  Vaping Use   Vaping Use: Never used  Substance Use Topics   Alcohol use: Yes    Alcohol/week: 4.0 standard drinks    Types: 4 Cans of beer per week   Drug use: No     Allergies   Adhesive [tape] and Shrimp [shellfish allergy]   Review of Systems Review of Systems  Gastrointestinal:  Negative for abdominal pain.  Musculoskeletal:  Negative for back pain.       + lateral mid rib pain  Skin:  Negative for color change and wound.    Physical Exam Triage Vital Signs ED Triage Vitals  Enc Vitals Group     BP 11/12/21  1310 (!) 147/84     Pulse Rate 11/12/21 1310 78     Resp 11/12/21 1310 18     Temp 11/12/21 1310 97.9 F (36.6 C)     Temp Source 11/12/21 1310 Oral     SpO2 11/12/21 1310 96 %     Weight 11/12/21 1309 260 lb (117.9 kg)     Height 11/12/21 1309 5' 11"  (1.803 m)     Head Circumference --      Peak Flow --      Pain Score 11/12/21 1308 8     Pain Loc --      Pain Edu? --      Excl. in [place]? --    No data found.  Updated Vital Signs BP (!) 147/84 (BP Location: Left Arm)    Pulse 78    Temp 97.9 F (36.6 C) (Oral)    Resp 18    Ht 5' 11"  (1.803 m)    Wt 260 lb (117.9 kg)    SpO2 96%    BMI 36.26 kg/m   Visual Acuity Right Eye  Distance:   Left Eye Distance:   Bilateral Distance:    Right Eye Near:   Left Eye Near:    Bilateral Near:     Physical Exam Vitals and nursing note reviewed.  Constitutional:      General: He is not in acute distress.    Appearance: He is obese. He is not toxic-appearing.  Eyes:     General: No scleral icterus.    Conjunctiva/sclera: Conjunctivae normal.  Pulmonary:     Effort: Pulmonary effort is normal.     Comments: On lateral ribs, there is no crepitations Chest:     Chest wall: Tenderness present.  Musculoskeletal:     Cervical back: Neck supple.  Skin:    Findings: No bruising, erythema or rash.  Neurological:     Mental Status: He is alert and oriented to person, place, and time.     Gait: Gait normal.  Psychiatric:        Mood and Affect: Mood normal.        Behavior: Behavior normal.        Thought Content: Thought content normal.        Judgment: Judgment normal.     UC Treatments / Results  Labs (all labs ordered are listed, but only abnormal results are displayed) Labs Reviewed - No data to display  EKG   Radiology DG Ribs Unilateral W/Chest Right  Result Date: 11/12/2021 CLINICAL DATA:  Right-sided pain post fall EXAM: RIGHT RIBS AND CHEST - 3+ VIEW COMPARISON:  06/01/2008 FINDINGS: No pneumothorax.  No pleural effusion.  Right lung clear. Minimally displaced fractures, lateral aspect right eighth and ninth ribs. Linear scarring or subsegmental atelectasis laterally at the left lung base. Heart size normal. IMPRESSION: Right eighth and ninth rib fractures, without pneumothorax or effusion. Electronically Signed   By: Lucrezia Europe M.D.   On: 11/12/2021 13:47    Procedures Procedures (including critical care time)  Medications Ordered in UC Medications - No data to display  Initial Impression / Assessment and Plan / UC Course  I have reviewed the triage vital signs and the nursing notes. Pertinent  imaging results that were available during my care  of the patient were reviewed by me and considered in my medical decision making (see chart for details). Closed fracture 8th and 9th right rib. I placed him on Norco for pain as noted. Instructed  him how to use a pillow to take deep breaths several times during the day to prevent pneumonia and if when he has to cough and sneeze. See instructions.     Final Clinical Impressions(s) / UC Diagnoses   Final diagnoses:  Fall, initial encounter  Closed fracture of one rib, unspecified laterality, initial encounter     Discharge Instructions      Apply ice on area of pain for 2 days Follow up with your primary care provider next week.      ED Prescriptions     Medication Sig Dispense Auth. Provider   HYDROcodone-acetaminophen (NORCO/VICODIN) 5-325 MG tablet Take 1 tablet by mouth every 6 (six) hours as needed. 15 tablet Rodriguez-Southworth, Sunday Spillers, PA-C      I have reviewed the PDMP during this encounter.   Shelby Mattocks, PA-C 11/12/21 1359

## 2022-03-06 IMAGING — CR DG RIBS W/ CHEST 3+V*R*
5 series · 5 of 5 positions shown · non-contrast
Comparison: 06/01/2008

CLINICAL DATA: Right-sided pain post fall

EXAM:
RIGHT RIBS AND CHEST - 3+ VIEW

[chest pa]
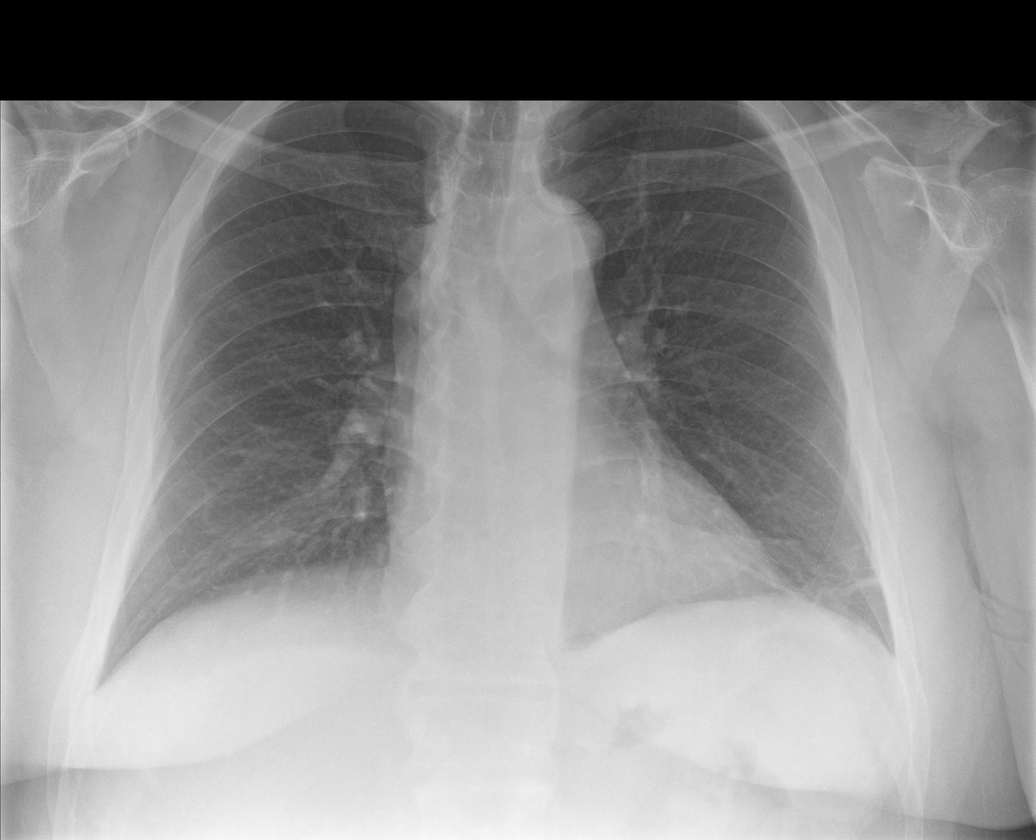

[rib pa (1 of 2)]
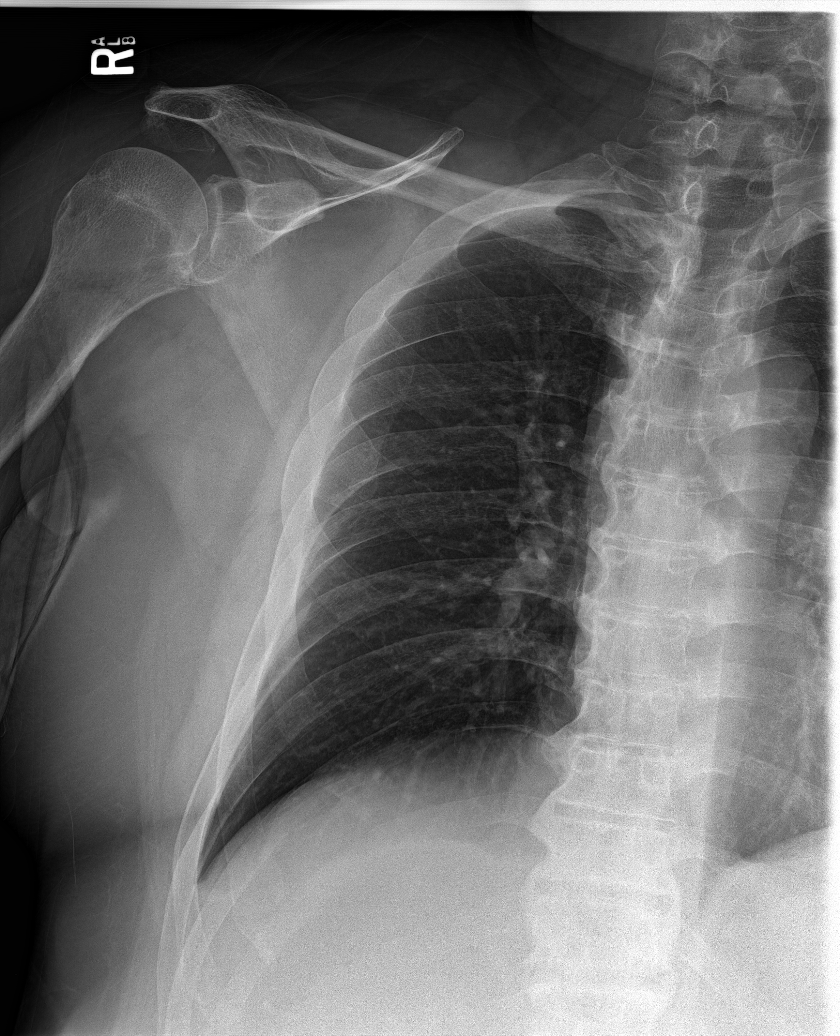

[rib pa (2 of 2)]
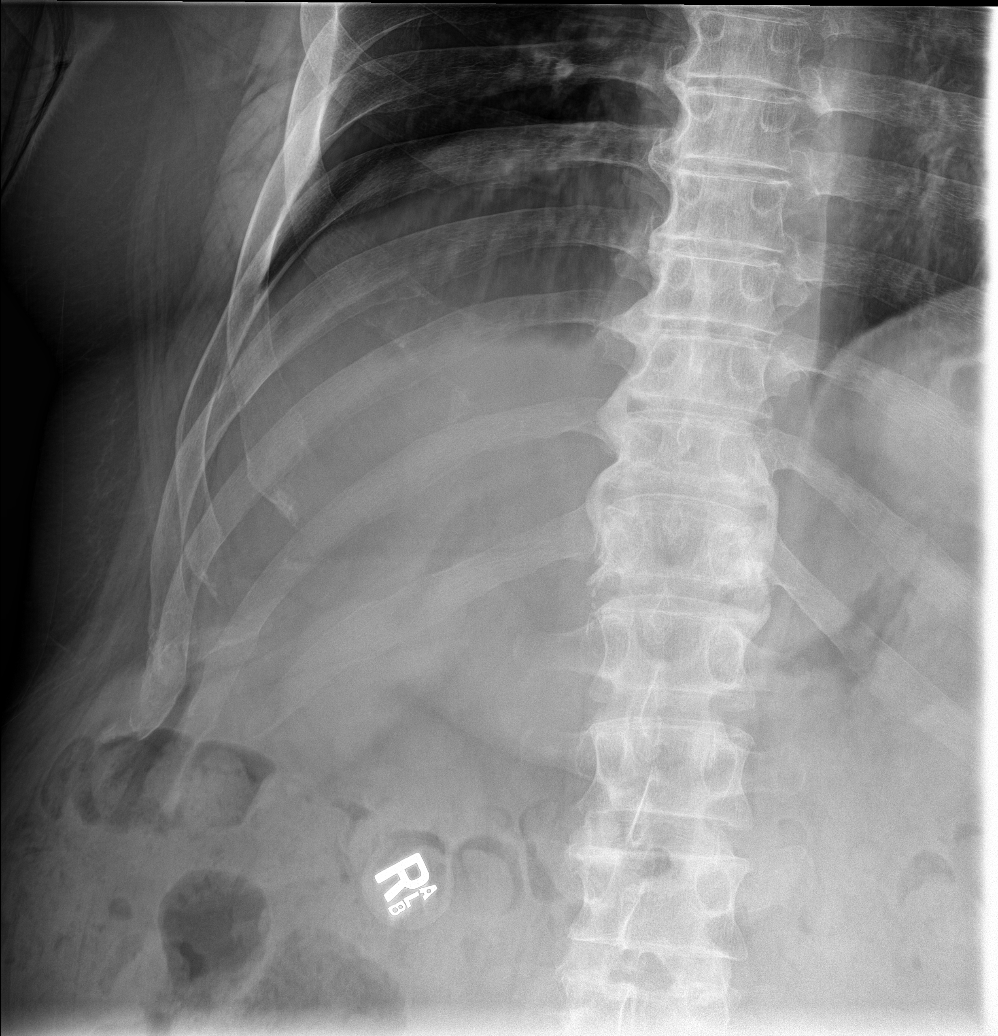

[rib obl (1 of 2)]
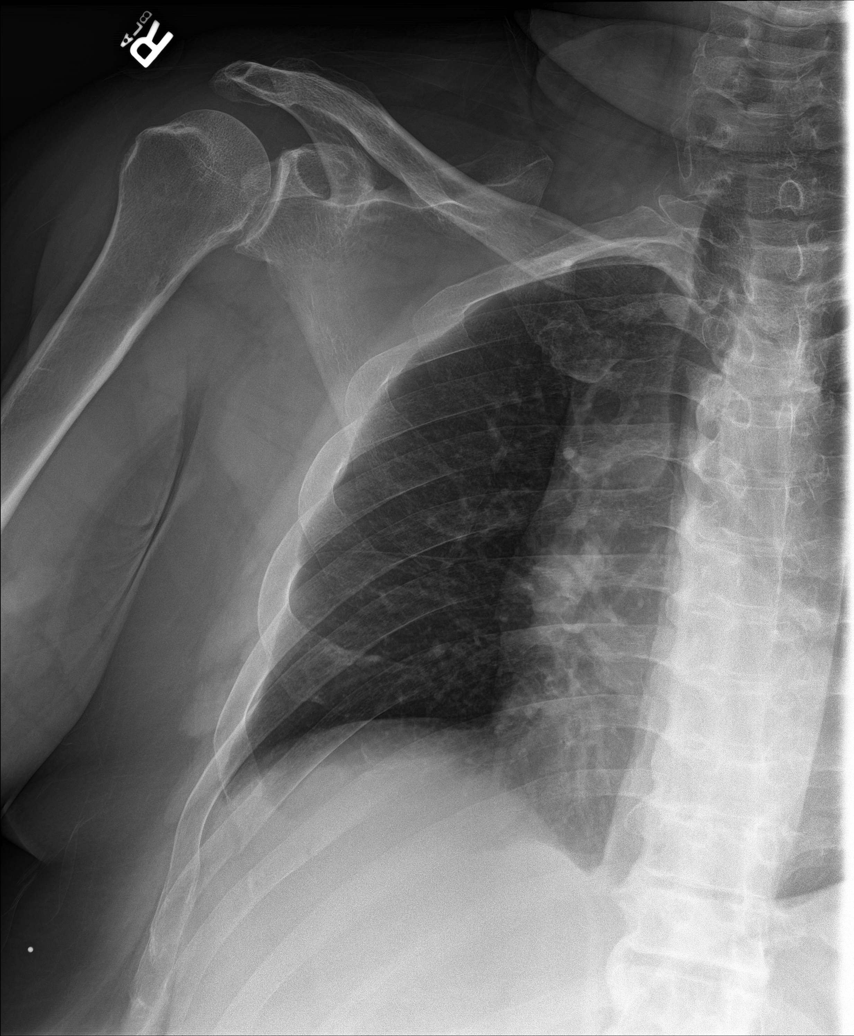

[rib obl (2 of 2)]
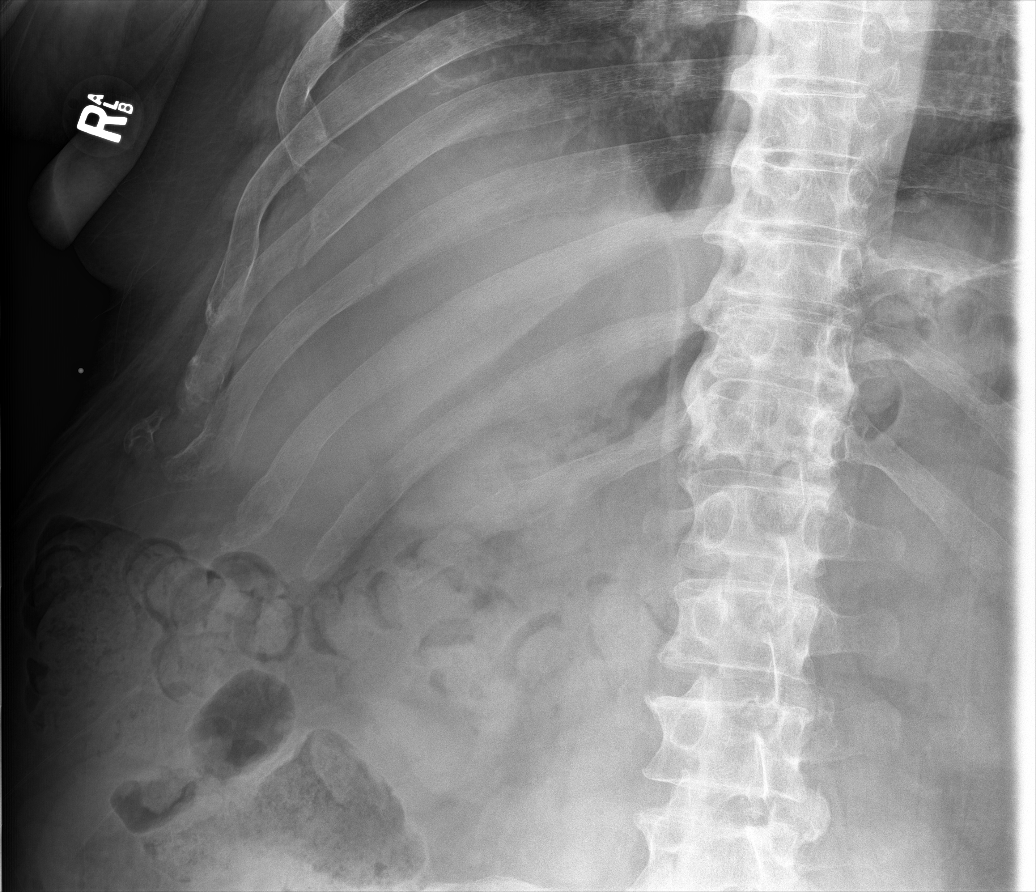

[5 of 5 positions shown; findings below may reference images not displayed]

FINDINGS: No pneumothorax.  No pleural effusion.  Right lung clear.

Minimally displaced fractures, lateral aspect right eighth and ninth
ribs.

Linear scarring or subsegmental atelectasis laterally at the left
lung base. Heart size normal.
IMPRESSION: Right eighth and ninth rib fractures, without pneumothorax or
effusion.
